# Patient Record
Sex: Female | Born: 1990 | Hispanic: Yes | Marital: Single | Smoking: Never smoker
Health system: Southern US, Community
[De-identification: ages and names within clinical notes are randomized; demographics above are authoritative.]

## PROBLEM LIST (undated history)

## (undated) DIAGNOSIS — I471 Supraventricular tachycardia, unspecified: Secondary | ICD-10-CM

## (undated) HISTORY — PX: ABLATION: SHX5711

---

## 2015-12-27 ENCOUNTER — Encounter (HOSPITAL_COMMUNITY): Payer: Self-pay | Admitting: Oncology

## 2015-12-27 ENCOUNTER — Emergency Department (HOSPITAL_COMMUNITY)
Admission: EM | Admit: 2015-12-27 | Discharge: 2015-12-27 | Disposition: A | Discharge status: Home / Self Care | Payer: No Typology Code available for payment source | Attending: Emergency Medicine | Admitting: Emergency Medicine

## 2015-12-27 ENCOUNTER — Emergency Department (HOSPITAL_COMMUNITY): Payer: No Typology Code available for payment source

## 2015-12-27 DIAGNOSIS — S39012A Strain of muscle, fascia and tendon of lower back, initial encounter: Secondary | ICD-10-CM | POA: Insufficient documentation

## 2015-12-27 DIAGNOSIS — M545 Low back pain, unspecified: Secondary | ICD-10-CM

## 2015-12-27 DIAGNOSIS — Y9389 Activity, other specified: Secondary | ICD-10-CM | POA: Diagnosis not present

## 2015-12-27 DIAGNOSIS — S3992XA Unspecified injury of lower back, initial encounter: Secondary | ICD-10-CM | POA: Diagnosis present

## 2015-12-27 DIAGNOSIS — Y92410 Unspecified street and highway as the place of occurrence of the external cause: Secondary | ICD-10-CM | POA: Insufficient documentation

## 2015-12-27 DIAGNOSIS — Z8679 Personal history of other diseases of the circulatory system: Secondary | ICD-10-CM | POA: Insufficient documentation

## 2015-12-27 DIAGNOSIS — Y998 Other external cause status: Secondary | ICD-10-CM | POA: Diagnosis not present

## 2015-12-27 DIAGNOSIS — Z3202 Encounter for pregnancy test, result negative: Secondary | ICD-10-CM | POA: Diagnosis not present

## 2015-12-27 HISTORY — DX: Supraventricular tachycardia, unspecified: I47.10

## 2015-12-27 HISTORY — DX: Supraventricular tachycardia: I47.1

## 2015-12-27 LAB — POC URINE PREG, ED: Preg Test, Ur: NEGATIVE

## 2015-12-27 MED ORDER — METHOCARBAMOL 500 MG PO TABS: 500.0000 mg | ORAL_TABLET | Freq: Two times a day (BID) | ORAL | Status: AC

## 2015-12-27 MED ORDER — OXYCODONE-ACETAMINOPHEN 5-325 MG PO TABS
1.0000 | ORAL_TABLET | ORAL | Status: DC | PRN
  Administered 2015-12-27: 1 via ORAL
  Filled 2015-12-27: qty 1

## 2015-12-27 MED ORDER — NAPROXEN 500 MG PO TABS: 500.0000 mg | ORAL_TABLET | Freq: Two times a day (BID) | ORAL | Status: AC

## 2015-12-27 NOTE — ED Notes (Signed)
Pt ambulatory to the bathroom without assistance.  
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
## 2015-12-27 NOTE — Discharge Instructions (Signed)
1. Medications: robaxin, naproxyn, vicodin, usual home medications °2. Treatment: rest, drink plenty of fluids, gentle stretching as discussed, alternate ice and heat °3. Follow Up: Please followup with your primary doctor in 3 days for discussion of your diagnoses and further evaluation after today's visit; if you do not have a primary care doctor use the resource guide provided to find one;  Return to the ER for worsening back pain, difficulty walking, loss of bowel or bladder control or other concerning symptoms ° ° ° ° °Back Exercises °The following exercises strengthen the muscles that help to support the back. They also help to keep the lower back flexible. Doing these exercises can help to prevent back pain or lessen existing pain. °If you have back pain or discomfort, try doing these exercises 2-3 times each day or as told by your health care provider. When the pain goes away, do them once each day, but increase the number of times that you repeat the steps for each exercise (do more repetitions). If you do not have back pain or discomfort, do these exercises once each day or as told by your health care provider. °EXERCISES °Single Knee to Chest °Repeat these steps 3-5 times for each leg: °1. Lie on your back on a firm bed or the floor with your legs extended. °2. Bring one knee to your chest. Your other leg should stay extended and in contact with the floor. °3. Hold your knee in place by grabbing your knee or thigh. °4. Pull on your knee until you feel a gentle stretch in your lower back. °5. Hold the stretch for 10-30 seconds. °6. Slowly release and straighten your leg. °Pelvic Tilt °Repeat these steps 5-10 times: °1. Lie on your back on a firm bed or the floor with your legs extended. °2. Bend your knees so they are pointing toward the ceiling and your feet are flat on the floor. °3. Tighten your lower abdominal muscles to press your lower back against the floor. This motion will tilt your pelvis so your  tailbone points up toward the ceiling instead of pointing to your feet or the floor. °4. With gentle tension and even breathing, hold this position for 5-10 seconds. °Cat-Cow °Repeat these steps until your lower back becomes more flexible: °1. Get into a hands-and-knees position on a firm surface. Keep your hands under your shoulders, and keep your knees under your hips. You may place padding under your knees for comfort. °2. Let your head hang down, and point your tailbone toward the floor so your lower back becomes rounded like the back of a cat. °3. Hold this position for 5 seconds. °4. Slowly lift your head and point your tailbone up toward the ceiling so your back forms a sagging arch like the back of a cow. °5. Hold this position for 5 seconds. °Press-Ups °Repeat these steps 5-10 times: °1. Lie on your abdomen (face-down) on the floor. °2. Place your palms near your head, about shoulder-width apart. °3. While you keep your back as relaxed as possible and keep your hips on the floor, slowly straighten your arms to raise the top half of your body and lift your shoulders. Do not use your back muscles to raise your upper torso. You may adjust the placement of your hands to make yourself more comfortable. °4. Hold this position for 5 seconds while you keep your back relaxed. °5. Slowly return to lying flat on the floor. °Bridges °Repeat these steps 10 times: °1. Lie on   your back on a firm surface. °2. Bend your knees so they are pointing toward the ceiling and your feet are flat on the floor. °3. Tighten your buttocks muscles and lift your buttocks off of the floor until your waist is at almost the same height as your knees. You should feel the muscles working in your buttocks and the back of your thighs. If you do not feel these muscles, slide your feet 1-2 inches farther away from your buttocks. °4. Hold this position for 3-5 seconds. °5. Slowly lower your hips to the starting position, and allow your buttocks  muscles to relax completely. °If this exercise is too easy, try doing it with your arms crossed over your chest. °Abdominal Crunches °Repeat these steps 5-10 times: °1. Lie on your back on a firm bed or the floor with your legs extended. °2. Bend your knees so they are pointing toward the ceiling and your feet are flat on the floor. °3. Cross your arms over your chest. °4. Tip your chin slightly toward your chest without bending your neck. °5. Tighten your abdominal muscles and slowly raise your trunk (torso) high enough to lift your shoulder blades a tiny bit off of the floor. Avoid raising your torso higher than that, because it can put too much stress on your low back and it does not help to strengthen your abdominal muscles. °6. Slowly return to your starting position. °Back Lifts °Repeat these steps 5-10 times: °1. Lie on your abdomen (face-down) with your arms at your sides, and rest your forehead on the floor. °2. Tighten the muscles in your legs and your buttocks. °3. Slowly lift your chest off of the floor while you keep your hips pressed to the floor. Keep the back of your head in line with the curve in your back. Your eyes should be looking at the floor. °4. Hold this position for 3-5 seconds. °5. Slowly return to your starting position. °SEEK MEDICAL CARE IF: °· Your back pain or discomfort gets much worse when you do an exercise. °· Your back pain or discomfort does not lessen within 2 hours after you exercise. °If you have any of these problems, stop doing these exercises right away. Do not do them again unless your health care provider says that you can. °SEEK IMMEDIATE MEDICAL CARE IF: °· You develop sudden, severe back pain. If this happens, stop doing the exercises right away. Do not do them again unless your health care provider says that you can. °  °This information is not intended to replace advice given to you by your health care provider. Make sure you discuss any questions you have with your  health care provider. °  °Document Released: 10/13/2004 Document Revised: 05/27/2015 Document Reviewed: 10/30/2014 °Elsevier Interactive Patient Education ©2016 [employer] ° °
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
## 2022-03-23 ENCOUNTER — Emergency Department (INDEPENDENT_AMBULATORY_CARE_PROVIDER_SITE_OTHER)
Admission: EM | Admit: 2022-03-23 | Discharge: 2022-03-23 | Disposition: A | Discharge status: Home / Self Care | Payer: 59 | Source: Home / Self Care

## 2022-03-23 ENCOUNTER — Encounter: Payer: Self-pay | Admitting: Emergency Medicine

## 2022-03-23 DIAGNOSIS — N6452 Nipple discharge: Secondary | ICD-10-CM | POA: Diagnosis not present

## 2022-03-23 MED ORDER — CEPHALEXIN 500 MG PO CAPS: 1000.0000 mg | ORAL_CAPSULE | Freq: Two times a day (BID) | ORAL | 0 refills | Status: AC

## 2022-03-23 NOTE — ED Provider Notes (Signed)
[facility]    CSN: [id number] Arrival date & time: 03/23/22  1612      History   Chief Complaint Chief Complaint  Patient presents with   Breast Discharge    HPI Lorraine Christensen is a 31 y.o. female.   HPI Pleasant 31 year old female presents with left nipple discharge for 1 day.  Patient reports discharge was brownish-yellow and is taking pictures on her phone revealing color of previous left breast discharge noted prior to arrival today.  PMH significant for SVT.  Past Medical History:  Diagnosis Date   SVT (supraventricular tachycardia) (HCC)     There are no problems to display for this patient.   Past Surgical History:  Procedure Laterality Date   ABLATION     x 2    OB History   No obstetric history on file.      Home Medications    Prior to Admission medications   Medication Sig Start Date End Date Taking? Authorizing Provider  cephALEXin (KEFLEX) 500 MG capsule Take 2 capsules (1,000 mg total) by mouth 2 (two) times daily for 7 days. 03/23/22 03/30/22 Yes Trevor Iha, FNP  acetaminophen (TYLENOL) 500 MG tablet Take 1,000 mg by mouth every 6 (six) hours as needed for moderate pain.    [provider]  methocarbamol (ROBAXIN) 500 MG tablet Take 1 tablet (500 mg total) by mouth 2 (two) times daily. 12/27/15   Muthersbaugh, Dahlia Client, PA-C  naproxen (NAPROSYN) 500 MG tablet Take 1 tablet (500 mg total) by mouth 2 (two) times daily with a meal. 12/27/15   Muthersbaugh, Dahlia Client, PA-C    Family History Family History  Problem Relation Age of Onset   Healthy Mother    Healthy Father     Social History Social History   Tobacco Use   Smoking status: Never   Smokeless tobacco: Never  Substance Use Topics   Alcohol use: Yes   Drug use: No     Allergies   Patient has no known allergies.   Review of Systems Review of Systems  Skin:        Left breast discharge x1 day     Physical Exam Triage Vital Signs ED Triage Vitals  Enc  Vitals Group     BP 03/23/22 1623 117/80     Pulse Rate 03/23/22 1623 100     Resp 03/23/22 1623 18     Temp 03/23/22 1623 98.8 F (37.1 C)     Temp Source 03/23/22 1623 Oral     SpO2 03/23/22 1623 97 %     Weight 03/23/22 1624 199 lb (90.3 kg)     Height 03/23/22 1624 5\' 2"  (1.575 m)     Head Circumference --      Peak Flow --      Pain Score 03/23/22 1624 5     Pain Loc --      Pain Edu? --      Excl. in GC? --    No data found.  Updated Vital Signs BP 117/80 (BP Location: Right Arm)   Pulse 100   Temp 98.8 F (37.1 C) (Oral)   Resp 18   Ht 5\' 2"  (1.575 m)   Wt 199 lb (90.3 kg)   LMP 03/19/2022 (Exact Date)   SpO2 97%   BMI 36.40 kg/m   Physical Exam Vitals and nursing note reviewed. Exam conducted with a chaperone present.  Constitutional:      General: She is not in acute distress.  Appearance: Normal appearance. She is obese. She is not ill-appearing.  HENT:     Head: Normocephalic and atraumatic.     Mouth/Throat:     Mouth: Mucous membranes are moist.     Pharynx: Oropharynx is clear.  Eyes:     Extraocular Movements: Extraocular movements intact.     Conjunctiva/sclera: Conjunctivae normal.     Pupils: Pupils are equal, round, and reactive to light.  Cardiovascular:     Rate and Rhythm: Normal rate and regular rhythm.     Pulses: Normal pulses.     Heart sounds: Normal heart sounds.  Pulmonary:     Effort: Pulmonary effort is normal.     Breath sounds: Normal breath sounds. No wheezing, rhonchi or rales.  Musculoskeletal:     Cervical back: Normal range of motion and neck supple.  Skin:    General: Skin is warm and dry.     Comments: Left breast (inferior areola): Nonerythematous, nonindurated, nonfluctuant, no discharge/drainage noted mild pressure elicited to express drainage without success  Neurological:     General: No focal deficit present.     Mental Status: She is alert and oriented to person, place, and time. Mental status is at baseline.       UC Treatments / Results  Labs (all labs ordered are listed, but only abnormal results are displayed) Labs Reviewed - No data to display  EKG   Radiology No results found.  Procedures Procedures (including critical care time)  Medications Ordered in UC Medications - No data to display  Initial Impression / Assessment and Plan / UC Course  I have reviewed the triage vital signs and the nursing notes.  Pertinent labs & imaging results that were available during my care of the patient were reviewed by me and considered in my medical decision making (see chart for details).     MDM: 1.  Discharge from breast-Rx'd Keflex. Instructed patient to take medication as directed with food to completion.  Encouraged patient increase daily water intake while taking this medication.  Advised patient if symptoms worsen and/or unresolved please follow-up with PCP or here for further evaluation. Final Clinical Impressions(s) / UC Diagnoses   Final diagnoses:  Discharge from breast     Discharge Instructions      Instructed patient to take medication as directed with food to completion.  Encouraged patient increase daily water intake while taking this medication.  Advised patient if symptoms worsen and/or unresolved please follow-up with PCP or here for further evaluation.     ED Prescriptions     Medication Sig Dispense Auth. Provider   cephALEXin (KEFLEX) 500 MG capsule Take 2 capsules (1,000 mg total) by mouth 2 (two) times daily for 7 days. 28 capsule Trevor Iha, FNP      PDMP not reviewed this encounter.   Trevor Iha, FNP 03/23/22 1656

## 2022-03-23 NOTE — ED Triage Notes (Signed)
Patient c/o left nipple discharge x 1 day.  Patient states that her left side of her breast was itchy and left arm pain.  Patient does not breastfeed.  Denies any OTC pain meds.

## 2022-03-23 NOTE — Discharge Instructions (Addendum)
Instructed patient to take medication as directed with food to completion.  Encouraged patient increase daily water intake while taking this medication.  Advised patient if symptoms worsen and/or unresolved please follow-up with PCP or here for further evaluation.
